# Patient Record
Sex: Female | Born: 1965 | Hispanic: No | Smoking: Never smoker
Health system: Southern US, Community
[De-identification: ages and names within clinical notes are randomized; demographics above are authoritative.]

## PROBLEM LIST (undated history)

## (undated) DIAGNOSIS — J45909 Unspecified asthma, uncomplicated: Secondary | ICD-10-CM

## (undated) DIAGNOSIS — E119 Type 2 diabetes mellitus without complications: Secondary | ICD-10-CM

## (undated) DIAGNOSIS — I10 Essential (primary) hypertension: Secondary | ICD-10-CM

## (undated) DIAGNOSIS — E785 Hyperlipidemia, unspecified: Secondary | ICD-10-CM

## (undated) DIAGNOSIS — E079 Disorder of thyroid, unspecified: Secondary | ICD-10-CM

---

## 2006-01-14 ENCOUNTER — Ambulatory Visit: Payer: Self-pay | Admitting: Family Medicine

## 2006-12-11 ENCOUNTER — Ambulatory Visit: Payer: Self-pay | Admitting: Unknown Physician Specialty

## 2010-11-21 ENCOUNTER — Ambulatory Visit: Payer: Self-pay

## 2013-06-14 ENCOUNTER — Emergency Department: Payer: Self-pay | Admitting: Emergency Medicine

## 2013-06-14 LAB — COMPREHENSIVE METABOLIC PANEL
Bilirubin,Total: 0.4 mg/dL (ref 0.2–1.0)
Co2: 30 mmol/L (ref 21–32)
Creatinine: 1.01 mg/dL (ref 0.60–1.30)
EGFR (African American): >60
EGFR (Non-African Amer.): >60
Glucose: 107 mg/dL — ABNORMAL HIGH (ref 65–99)
SGOT(AST): 31 U/L (ref 15–37)
Sodium: 139 mmol/L (ref 136–145)
Total Protein: 7.8 g/dL (ref 6.4–8.2)

## 2013-06-14 LAB — URINALYSIS, COMPLETE
Bacteria: NONE SEEN
Bilirubin,UR: NEGATIVE
Glucose,UR: NEGATIVE mg/dL (ref 0–75)
Nitrite: NEGATIVE
Ph: 5 (ref 4.5–8.0)
Protein: 100
RBC,UR: NONE SEEN /HPF (ref 0–5)

## 2013-06-14 LAB — CBC: WBC: 5.8 10*3/uL (ref 3.6–11.0)

## 2013-07-11 ENCOUNTER — Other Ambulatory Visit (HOSPITAL_COMMUNITY): Payer: Self-pay

## 2013-07-11 ENCOUNTER — Emergency Department (HOSPITAL_COMMUNITY): Payer: BC Managed Care – PPO

## 2013-07-11 ENCOUNTER — Encounter (HOSPITAL_COMMUNITY): Payer: Self-pay | Admitting: Nurse Practitioner

## 2013-07-11 ENCOUNTER — Emergency Department (HOSPITAL_COMMUNITY)
Admission: EM | Admit: 2013-07-11 | Discharge: 2013-07-11 | Disposition: A | Discharge status: Home / Self Care | Payer: BC Managed Care – PPO | Attending: Emergency Medicine | Admitting: Emergency Medicine

## 2013-07-11 DIAGNOSIS — S6990XA Unspecified injury of unspecified wrist, hand and finger(s), initial encounter: Secondary | ICD-10-CM | POA: Insufficient documentation

## 2013-07-11 DIAGNOSIS — Z79899 Other long term (current) drug therapy: Secondary | ICD-10-CM | POA: Insufficient documentation

## 2013-07-11 DIAGNOSIS — Y939 Activity, unspecified: Secondary | ICD-10-CM | POA: Insufficient documentation

## 2013-07-11 DIAGNOSIS — IMO0002 Reserved for concepts with insufficient information to code with codable children: Secondary | ICD-10-CM | POA: Insufficient documentation

## 2013-07-11 DIAGNOSIS — S46912A Strain of unspecified muscle, fascia and tendon at shoulder and upper arm level, left arm, initial encounter: Secondary | ICD-10-CM

## 2013-07-11 DIAGNOSIS — Z23 Encounter for immunization: Secondary | ICD-10-CM | POA: Insufficient documentation

## 2013-07-11 DIAGNOSIS — S51809A Unspecified open wound of unspecified forearm, initial encounter: Secondary | ICD-10-CM | POA: Insufficient documentation

## 2013-07-11 DIAGNOSIS — S59909A Unspecified injury of unspecified elbow, initial encounter: Secondary | ICD-10-CM | POA: Insufficient documentation

## 2013-07-11 DIAGNOSIS — S61409A Unspecified open wound of unspecified hand, initial encounter: Secondary | ICD-10-CM | POA: Insufficient documentation

## 2013-07-11 DIAGNOSIS — Y92410 Unspecified street and highway as the place of occurrence of the external cause: Secondary | ICD-10-CM | POA: Insufficient documentation

## 2013-07-11 DIAGNOSIS — S59902A Unspecified injury of left elbow, initial encounter: Secondary | ICD-10-CM

## 2013-07-11 MED ORDER — HYDROCODONE-ACETAMINOPHEN 5-325 MG PO TABS: 1.0000 | ORAL_TABLET | Freq: Four times a day (QID) | ORAL | Status: AC | PRN

## 2013-07-11 MED ORDER — NAPROXEN 500 MG PO TABS: 500.0000 mg | ORAL_TABLET | Freq: Two times a day (BID) | ORAL | Status: AC

## 2013-07-11 MED ORDER — TETANUS-DIPHTH-ACELL PERTUSSIS 5-2.5-18.5 LF-MCG/0.5 IM SUSP
0.5000 mL | Freq: Once | INTRAMUSCULAR | Status: AC
  Administered 2013-07-11: 0.5 mL via INTRAMUSCULAR
  Filled 2013-07-11: qty 0.5
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
## 2013-07-11 NOTE — ED Notes (Signed)
Patient transported to CT 

## 2015-07-26 ENCOUNTER — Ambulatory Visit: Payer: 59 | Admitting: Family Medicine

## 2015-08-28 ENCOUNTER — Other Ambulatory Visit: Payer: Self-pay | Admitting: Internal Medicine

## 2015-08-28 DIAGNOSIS — Z1231 Encounter for screening mammogram for malignant neoplasm of breast: Secondary | ICD-10-CM

## 2015-09-06 ENCOUNTER — Ambulatory Visit
Admission: RE | Admit: 2015-09-06 | Discharge: 2015-09-06 | Disposition: A | Discharge status: Home / Self Care | Payer: 59 | Source: Ambulatory Visit | Attending: Internal Medicine | Admitting: Internal Medicine

## 2015-09-06 DIAGNOSIS — Z1231 Encounter for screening mammogram for malignant neoplasm of breast: Secondary | ICD-10-CM | POA: Insufficient documentation

## 2015-09-27 ENCOUNTER — Ambulatory Visit: Payer: 59 | Admitting: Cardiovascular Disease

## 2015-10-06 ENCOUNTER — Ambulatory Visit: Payer: 59 | Admitting: Cardiovascular Disease

## 2015-11-23 ENCOUNTER — Ambulatory Visit: Payer: 59 | Admitting: Cardiovascular Disease

## 2016-10-04 ENCOUNTER — Encounter: Admission: RE | Payer: Self-pay | Source: Ambulatory Visit

## 2016-10-04 ENCOUNTER — Ambulatory Visit: Admission: RE | Admit: 2016-10-04 | Payer: 59 | Source: Ambulatory Visit | Admitting: Unknown Physician Specialty

## 2016-10-04 SURGERY — COLONOSCOPY WITH PROPOFOL
Anesthesia: General

## 2016-12-03 ENCOUNTER — Other Ambulatory Visit: Payer: Self-pay | Admitting: Internal Medicine

## 2016-12-03 DIAGNOSIS — Z1231 Encounter for screening mammogram for malignant neoplasm of breast: Secondary | ICD-10-CM

## 2016-12-04 ENCOUNTER — Ambulatory Visit
Admission: RE | Admit: 2016-12-04 | Discharge: 2016-12-04 | Disposition: A | Discharge status: Home / Self Care | Payer: BLUE CROSS/BLUE SHIELD | Source: Ambulatory Visit | Attending: Internal Medicine | Admitting: Internal Medicine

## 2016-12-04 ENCOUNTER — Encounter: Payer: Self-pay | Admitting: Radiology

## 2016-12-04 DIAGNOSIS — Z1231 Encounter for screening mammogram for malignant neoplasm of breast: Secondary | ICD-10-CM | POA: Insufficient documentation

## 2019-04-21 ENCOUNTER — Other Ambulatory Visit: Payer: Self-pay | Admitting: Internal Medicine

## 2019-04-21 DIAGNOSIS — Z1231 Encounter for screening mammogram for malignant neoplasm of breast: Secondary | ICD-10-CM

## 2019-04-21 DIAGNOSIS — R42 Dizziness and giddiness: Secondary | ICD-10-CM

## 2019-04-21 DIAGNOSIS — H8113 Benign paroxysmal vertigo, bilateral: Secondary | ICD-10-CM

## 2019-05-03 ENCOUNTER — Ambulatory Visit: Payer: PRIVATE HEALTH INSURANCE

## 2020-06-08 ENCOUNTER — Other Ambulatory Visit: Payer: Self-pay | Admitting: Internal Medicine

## 2020-06-08 DIAGNOSIS — Z1231 Encounter for screening mammogram for malignant neoplasm of breast: Secondary | ICD-10-CM

## 2021-09-24 ENCOUNTER — Other Ambulatory Visit: Payer: Self-pay | Admitting: Internal Medicine

## 2021-09-24 DIAGNOSIS — Z1231 Encounter for screening mammogram for malignant neoplasm of breast: Secondary | ICD-10-CM

## 2021-10-11 ENCOUNTER — Ambulatory Visit (INDEPENDENT_AMBULATORY_CARE_PROVIDER_SITE_OTHER): Payer: 59

## 2021-10-11 ENCOUNTER — Encounter: Payer: Self-pay | Admitting: Podiatry

## 2021-10-11 ENCOUNTER — Ambulatory Visit: Payer: 59 | Admitting: Podiatry

## 2021-10-11 ENCOUNTER — Other Ambulatory Visit: Payer: Self-pay

## 2021-10-11 DIAGNOSIS — M7752 Other enthesopathy of left foot: Secondary | ICD-10-CM

## 2021-10-11 DIAGNOSIS — M21861 Other specified acquired deformities of right lower leg: Secondary | ICD-10-CM

## 2021-10-11 DIAGNOSIS — M7750 Other enthesopathy of unspecified foot: Secondary | ICD-10-CM

## 2021-10-11 DIAGNOSIS — M7661 Achilles tendinitis, right leg: Secondary | ICD-10-CM | POA: Diagnosis not present

## 2021-10-11 MED ORDER — MELOXICAM 15 MG PO TABS: 15.0000 mg | ORAL_TABLET | Freq: Every day | ORAL | 3 refills | Status: AC

## 2021-10-11 MED ORDER — METHYLPREDNISOLONE 4 MG PO TBPK: ORAL_TABLET | ORAL | 0 refills | Status: DC

## 2021-10-11 MED ORDER — METHYLPREDNISOLONE 4 MG PO TBPK: ORAL_TABLET | ORAL | 0 refills | Status: AC

## 2021-10-11 MED ORDER — MELOXICAM 15 MG PO TABS: 15.0000 mg | ORAL_TABLET | Freq: Every day | ORAL | 3 refills | Status: DC

## 2021-10-11 NOTE — Progress Notes (Signed)
  Subjective:  Patient ID: Shelia Whitney, female    DOB: 1966/04/29,  MRN: [id number]  Chief Complaint  Patient presents with   Tendonitis      (np) right foot pain near the heel    55 y.o. female presents with the above complaint. History confirmed with patient. Present for quite some time nearly a year but she has become been working through it.  Its mostly in the back of the heel until it is being pulled and stabbing pain.  She 2 weeks ago went to the [employer] store and bought inserts which have helped the pain on the bottom of the foot in the arch but not on the back of the heel this is gotten worse.   Objective:  Physical Exam: warm, good capillary refill, no trophic changes or ulcerative lesions, normal DP and PT pulses, and normal sensory exam. Left Foot: normal exam, no swelling, tenderness, instability; ligaments intact, full range of motion of all ankle/foot joints Right Foot: tenderness at Achilles tendon insertion and gastrocnemius equinus is noted with a positive silverskiold test  No images are attached to the encounter.  Radiographs: Multiple views x-ray of the right foot: no fracture, dislocation, swelling or degenerative changes noted, plantar calcaneal spur, and posterior calcaneal spur Assessment:   1. Achilles tendinitis, right leg   2. Gastrocnemius equinus of right lower extremity      Plan:  Patient was evaluated and treated and all questions answered.  Discussed the etiology and treatment options for Achilles tendinitis including stretching, formal physical therapy with an eccentric exercises therapy plan, supportive shoegears such as a running shoe or sneaker, heel lifts, topical and oral medications.  We also discussed that I do not routinely perform injections in this area because of the risk of an increased damage or rupture of the tendon.  We also discussed the role of surgical treatment of this for patients who do not improve after exhausting non-surgical  treatment options.  -XR reviewed with patient -Educated on stretching and icing of the affected limb. -Rx for Medrol and Meloxicam. Advised to start Meloxicam the day after completion of oral steroid taper. -Recommend WBAT in CAM boot which I dispensed -She has a night splint and she will use this as well -She is traveling to [place] to visit her family soon and I recommended she wear the boot during this time as well  Return in about 4 weeks (around 11/08/2021) for re-check Achilles tendon.

## 2021-10-11 NOTE — Addendum Note (Signed)
Addended byLilian Kapur, Rashied Corallo R on: 10/11/2021 04:57 PM   Modules accepted: Orders

## 2021-10-11 NOTE — Patient Instructions (Signed)
Look for Voltaren gel at the pharmacy over the counter or online (also known as diclofenac 1% gel). Apply to the painful areas 3-4x daily with the supplied dosing card. Allow to dry for 10 minutes before going into socks/shoes   Look for an "EvenUp" shoe attachment on [employer] or at [employer]. This will level out your hips while you are walking in the CAM boot. Wear this on the other foot around a supportive sneaker:       Achilles Tendinitis  with Rehab Achilles tendinitis is a disorder of the Achilles tendon. The Achilles tendon connects the large calf muscles (Gastrocnemius and Soleus) to the heel bone (calcaneus). This tendon is sometimes called the heel cord. It is important for pushing-off and standing on your toes and is important for walking, running, or jumping. Tendinitis is often caused by overuse and repetitive microtrauma. SYMPTOMS Pain, tenderness, swelling, warmth, and redness may occur over the Achilles tendon even at rest. Pain with pushing off, or flexing or extending the ankle. Pain that is worsened after or during activity. CAUSES  Overuse sometimes seen with rapid increase in exercise programs or in sports requiring running and jumping. Poor physical conditioning (strength and flexibility or endurance). Running sports, especially training running down hills. Inadequate warm-up before practice or play or failure to stretch before participation. Injury to the tendon. PREVENTION  Warm up and stretch before practice or competition. Allow time for adequate rest and recovery between practices and competition. Keep up conditioning. Keep up ankle and leg flexibility. Improve or keep muscle strength and endurance. Improve cardiovascular fitness. Use proper technique. Use proper equipment (shoes, skates). To help prevent recurrence, taping, protective strapping, or an adhesive bandage may be recommended for several weeks after healing is complete. PROGNOSIS  Recovery may take  weeks to several months to heal. Longer recovery is expected if symptoms have been prolonged. Recovery is usually quicker if the inflammation is due to a direct blow as compared with overuse or sudden strain. RELATED COMPLICATIONS  Healing time will be prolonged if the condition is not correctly treated. The injury must be given plenty of time to heal. Symptoms can reoccur if activity is resumed too soon. Untreated, tendinitis may increase the risk of tendon rupture requiring additional time for recovery and possibly surgery. TREATMENT  The first treatment consists of rest anti-inflammatory medication, and ice to relieve the pain. Stretching and strengthening exercises after resolution of pain will likely help reduce the risk of recurrence. Referral to a physical therapist or athletic trainer for further evaluation and treatment may be helpful. A walking boot or cast may be recommended to rest the Achilles tendon. This can help break the cycle of inflammation and microtrauma. Arch supports (orthotics) may be prescribed or recommended by your caregiver as an adjunct to therapy and rest. Surgery to remove the inflamed tendon lining or degenerated tendon tissue is rarely necessary and has shown less than predictable results. MEDICATION  Nonsteroidal anti-inflammatory medications, such as aspirin and ibuprofen, may be used for pain and inflammation relief. Do not take within 7 days before surgery. Take these as directed by your caregiver. Contact your caregiver immediately if any bleeding, stomach upset, or signs of allergic reaction occur. Other minor pain relievers, such as acetaminophen, may also be used. Pain relievers may be prescribed as necessary by your caregiver. Do not take prescription pain medication for longer than 4 to 7 days. Use only as directed and only as much as you need. Cortisone injections are rarely indicated.  Cortisone injections may weaken tendons and predispose to rupture. It is  better to give the condition more time to heal than to use them. HEAT AND COLD Cold is used to relieve pain and reduce inflammation for acute and chronic Achilles tendinitis. Cold should be applied for 10 to 15 minutes every 2 to 3 hours for inflammation and pain and immediately after any activity that aggravates your symptoms. Use ice packs or an ice massage. Heat may be used before performing stretching and strengthening activities prescribed by your caregiver. Use a heat pack or a warm soak. SEEK MEDICAL CARE IF: Symptoms get worse or do not improve in 2 weeks despite treatment. New, unexplained symptoms develop. Drugs used in treatment may produce side effects.  EXERCISES:  RANGE OF MOTION (ROM) AND STRETCHING EXERCISES - Achilles Tendinitis  These exercises may help you when beginning to rehabilitate your injury. Your symptoms may resolve with or without further involvement from your physician, physical therapist or athletic trainer. While completing these exercises, remember:  Restoring tissue flexibility helps normal motion to return to the joints. This allows healthier, less painful movement and activity. An effective stretch should be held for at least 30 seconds. A stretch should never be painful. You should only feel a gentle lengthening or release in the stretched tissue.  STRETCH  Gastroc, Standing  Place hands on wall. Extend right / left leg, keeping the front knee somewhat bent. Slightly point your toes inward on your back foot. Keeping your right / left heel on the floor and your knee straight, shift your weight toward the wall, not allowing your back to arch. You should feel a gentle stretch in the right / left calf. Hold this position for 10 seconds. Repeat 3 times. Complete this stretch 2 times per day.  STRETCH  Soleus, Standing  Place hands on wall. Extend right / left leg, keeping the other knee somewhat bent. Slightly point your toes inward on your back foot. Keep  your right / left heel on the floor, bend your back knee, and slightly shift your weight over the back leg so that you feel a gentle stretch deep in your back calf. Hold this position for 10 seconds. Repeat 3 times. Complete this stretch 2 times per day.  STRETCH  Gastrocsoleus, Standing  Note: This exercise can place a lot of stress on your foot and ankle. Please complete this exercise only if specifically instructed by your caregiver.  Place the ball of your right / left foot on a step, keeping your other foot firmly on the same step. Hold on to the wall or a rail for balance. Slowly lift your other foot, allowing your body weight to press your heel down over the edge of the step. You should feel a stretch in your right / left calf. Hold this position for 10 seconds. Repeat this exercise with a slight bend in your knee. Repeat 3 times. Complete this stretch 2 times per day.   STRENGTHENING EXERCISES - Achilles Tendinitis These exercises may help you when beginning to rehabilitate your injury. They may resolve your symptoms with or without further involvement from your physician, physical therapist or athletic trainer. While completing these exercises, remember:  Muscles can gain both the endurance and the strength needed for everyday activities through controlled exercises. Complete these exercises as instructed by your physician, physical therapist or athletic trainer. Progress the resistance and repetitions only as guided. You may experience muscle soreness or fatigue, but the pain or discomfort you  are trying to eliminate should never worsen during these exercises. If this pain does worsen, stop and make certain you are following the directions exactly. If the pain is still present after adjustments, discontinue the exercise until you can discuss the trouble with your clinician.  STRENGTH - Plantar-flexors  Sit with your right / left leg extended. Holding onto both ends of a rubber exercise  band/tubing, loop it around the ball of your foot. Keep a slight tension in the band. Slowly push your toes away from you, pointing them downward. Hold this position for 10 seconds. Return slowly, controlling the tension in the band/tubing. Repeat 3 times. Complete this exercise 2 times per day.   STRENGTH - Plantar-flexors  Stand with your feet shoulder width apart. Steady yourself with a wall or table using as little support as needed. Keeping your weight evenly spread over the width of your feet, rise up on your toes.* Hold this position for 10 seconds. Repeat 3 times. Complete this exercise 2 times per day.  *If this is too easy, shift your weight toward your right / left leg until you feel challenged. Ultimately, you may be asked to do this exercise with your right / left foot only.  STRENGTH  Plantar-flexors, Eccentric  Note: This exercise can place a lot of stress on your foot and ankle. Please complete this exercise only if specifically instructed by your caregiver.  Place the balls of your feet on a step. With your hands, use only enough support from a wall or rail to keep your balance. Keep your knees straight and rise up on your toes. Slowly shift your weight entirely to your right / left toes and pick up your opposite foot. Gently and with controlled movement, lower your weight through your right / left foot so that your heel drops below the level of the step. You will feel a slight stretch in the back of your calf at the end position. Use the healthy leg to help rise up onto the balls of both feet, then lower weight only on the right / left leg again. Build up to 15 repetitions. Then progress to 3 consecutive sets of 15 repetitions.* After completing the above exercise, complete the same exercise with a slight knee bend (about 30 degrees). Again, build up to 15 repetitions. Then progress to 3 consecutive sets of 15 repetitions.* Perform this exercise 2 times per day.  *When you  easily complete 3 sets of 15, your physician, physical therapist or athletic trainer may advise you to add resistance by wearing a backpack filled with additional weight.  STRENGTH - Plantar Flexors, Seated  Sit on a chair that allows your feet to rest flat on the ground. If necessary, sit at the edge of the chair. Keeping your toes firmly on the ground, lift your right / left heel as far as you can without increasing any discomfort in your ankle. Repeat 3 times. Complete this exercise 2 times a day.

## 2021-11-08 ENCOUNTER — Ambulatory Visit: Payer: 59 | Admitting: Podiatry

## 2021-11-20 ENCOUNTER — Ambulatory Visit (INDEPENDENT_AMBULATORY_CARE_PROVIDER_SITE_OTHER): Payer: Self-pay | Admitting: Podiatry

## 2021-11-20 DIAGNOSIS — Z91199 Patient's noncompliance with other medical treatment and regimen due to unspecified reason: Secondary | ICD-10-CM

## 2021-11-20 NOTE — Progress Notes (Signed)
Patient was no-show for appointment today 

## 2021-12-14 ENCOUNTER — Other Ambulatory Visit: Payer: Self-pay

## 2021-12-14 ENCOUNTER — Ambulatory Visit
Admission: RE | Admit: 2021-12-14 | Discharge: 2021-12-14 | Disposition: A | Discharge status: Home / Self Care | Payer: BC Managed Care – PPO | Source: Ambulatory Visit | Attending: Internal Medicine | Admitting: Internal Medicine

## 2021-12-14 DIAGNOSIS — Z1231 Encounter for screening mammogram for malignant neoplasm of breast: Secondary | ICD-10-CM | POA: Diagnosis present

## 2022-01-24 ENCOUNTER — Encounter: Payer: Self-pay | Admitting: Gastroenterology

## 2022-01-25 ENCOUNTER — Ambulatory Visit: Payer: BC Managed Care – PPO | Admitting: Anesthesiology

## 2022-01-25 ENCOUNTER — Encounter
Admission: RE | Disposition: A | Discharge status: Home / Self Care | Payer: Self-pay | Source: Home / Self Care | Attending: Gastroenterology

## 2022-01-25 ENCOUNTER — Encounter: Payer: Self-pay | Admitting: Gastroenterology

## 2022-01-25 ENCOUNTER — Ambulatory Visit
Admission: RE | Admit: 2022-01-25 | Discharge: 2022-01-25 | Disposition: A | Discharge status: Home / Self Care | Payer: BC Managed Care – PPO | Attending: Gastroenterology | Admitting: Gastroenterology

## 2022-01-25 ENCOUNTER — Other Ambulatory Visit: Payer: Self-pay

## 2022-01-25 DIAGNOSIS — E119 Type 2 diabetes mellitus without complications: Secondary | ICD-10-CM | POA: Insufficient documentation

## 2022-01-25 DIAGNOSIS — K529 Noninfective gastroenteritis and colitis, unspecified: Secondary | ICD-10-CM | POA: Insufficient documentation

## 2022-01-25 DIAGNOSIS — K640 First degree hemorrhoids: Secondary | ICD-10-CM | POA: Insufficient documentation

## 2022-01-25 DIAGNOSIS — Z8719 Personal history of other diseases of the digestive system: Secondary | ICD-10-CM | POA: Diagnosis not present

## 2022-01-25 DIAGNOSIS — Z1211 Encounter for screening for malignant neoplasm of colon: Secondary | ICD-10-CM | POA: Diagnosis present

## 2022-01-25 HISTORY — PX: COLONOSCOPY: SHX5424

## 2022-01-25 HISTORY — DX: Disorder of thyroid, unspecified: E07.9

## 2022-01-25 HISTORY — DX: Essential (primary) hypertension: I10

## 2022-01-25 HISTORY — DX: Unspecified asthma, uncomplicated: J45.909

## 2022-01-25 HISTORY — DX: Type 2 diabetes mellitus without complications: E11.9

## 2022-01-25 HISTORY — DX: Hyperlipidemia, unspecified: E78.5

## 2022-01-25 LAB — GLUCOSE, CAPILLARY: Glucose-Capillary: 75 mg/dL (ref 70–99)

## 2022-01-25 SURGERY — COLONOSCOPY
Anesthesia: General

## 2022-01-25 MED ORDER — LIDOCAINE HCL (CARDIAC) PF 100 MG/5ML IV SOSY
PREFILLED_SYRINGE | INTRAVENOUS | Status: DC | PRN
  Administered 2022-01-25: 50 mg via INTRAVENOUS

## 2022-01-25 MED ORDER — PROPOFOL 500 MG/50ML IV EMUL
INTRAVENOUS | Status: DC | PRN
  Administered 2022-01-25: 175 ug/kg/min via INTRAVENOUS

## 2022-01-25 MED ORDER — SODIUM CHLORIDE 0.9 % IV SOLN: INTRAVENOUS | Status: DC

## 2022-01-25 MED ORDER — PROPOFOL 500 MG/50ML IV EMUL
INTRAVENOUS | Status: AC
  Filled 2022-01-25: qty 100

## 2022-01-25 MED ORDER — PROPOFOL 10 MG/ML IV BOLUS
INTRAVENOUS | Status: DC | PRN
Start: 2022-01-25 — End: 2022-01-25
  Administered 2022-01-25: 60 mg via INTRAVENOUS
  Administered 2022-01-25: 20 mg via INTRAVENOUS

## 2022-01-25 NOTE — H&P (Signed)
? ?Pre-Procedure H&P ?  ?Patient ID: Shelia Whitney is a 56 y.o. female. ? ?Gastroenterology Provider: Annamaria Helling, DO ? ?Referring Provider: Dawson Bills, NP ?PCP: Tracie Harrier, MD ? ?Date: 01/25/2022 ? ?HPI ?Ms. Shelia Whitney is a 56 y.o. female who presents today for Colonoscopy for initial screening colonoscopy. ? ?Patient undergoing initial screening colonoscopy today.  Bowel movements at home are normal without melena hematochezia diarrhea or constipation.  She does have a reported history of internal hemorrhoids ?Most recent lab work A1c 6.8 hemoglobin 15.9 MCV 88 platelets 241,000 creatinine 1.0 ? ?No current GI complaints ? ?Past Medical History:  ?Diagnosis Date  ? Asthma   ? Diabetes mellitus without complication ([place])   ? Hyperlipidemia   ? Hypertension   ? Thyroid disease   ? ? ?Past Surgical History:  ?Procedure Laterality Date  ? CESAREAN SECTION    ? ? ?Family History ?No h/o GI disease or malignancy ? ?Review of Systems  ?Constitutional:  Negative for activity change, appetite change, chills, diaphoresis, fatigue, fever and unexpected weight change.  ?HENT:  Negative for trouble swallowing and voice change.   ?Respiratory:  Negative for shortness of breath and wheezing.   ?Cardiovascular:  Negative for chest pain, palpitations and leg swelling.  ?Gastrointestinal:  Negative for abdominal distention, abdominal pain, anal bleeding, blood in stool, constipation, diarrhea, nausea, rectal pain and vomiting.  ?Musculoskeletal:  Negative for arthralgias and myalgias.  ?Skin:  Negative for color change and pallor.  ?Neurological:  Negative for dizziness, syncope and weakness.  ?Psychiatric/Behavioral:  Negative for confusion.   ?All other systems reviewed and are negative.  ? ?Medications ?No current facility-administered medications on file prior to encounter.  ? ?Current Outpatient Medications on File Prior to Encounter  ?Medication Sig Dispense Refill  ? albuterol (PROVENTIL HFA;VENTOLIN HFA)  108 (90 BASE) MCG/ACT inhaler Inhale 2 puffs into the lungs every 6 (six) hours as needed for wheezing.    ? Fluticasone-Salmeterol (ADVAIR) 250-50 MCG/DOSE AEPB Inhale 1 puff into the lungs every 12 (twelve) hours.    ? losartan (COZAAR) 50 MG tablet Take 50 mg by mouth daily.    ? metFORMIN (GLUCOPHAGE) 500 MG tablet Take by mouth 2 (two) times daily with a meal.    ? naproxen (NAPROSYN) 500 MG tablet Take 1 tablet (500 mg total) by mouth 2 (two) times daily. 14 tablet 0  ? Semaglutide,0.25 or 0.5MG /DOS, (OZEMPIC, 0.25 OR 0.5 MG/DOSE,) 2 MG/3ML SOPN Inject into the skin.    ? HYDROcodone-acetaminophen (NORCO/VICODIN) 5-325 MG per tablet Take 1-2 tablets by mouth every 6 (six) hours as needed for pain. (Patient not taking: Reported on 01/25/2022) 10 tablet 0  ? loratadine (CLARITIN) 10 MG tablet Take 10 mg by mouth daily. (Patient not taking: Reported on 01/25/2022)    ? meclizine (ANTIVERT) 25 MG tablet Take 25 mg by mouth 3 (three) times daily as needed for dizziness. (Patient not taking: Reported on 01/25/2022)    ? meloxicam (MOBIC) 15 MG tablet Take 1 tablet (15 mg total) by mouth daily. 30 tablet 3  ? methylPREDNISolone (MEDROL DOSEPAK) 4 MG TBPK tablet 6 day dose pack - take as directed (Patient not taking: Reported on 01/25/2022) 21 tablet 0  ? ? ?Pertinent medications related to GI and procedure were reviewed by me with the patient prior to the procedure ? ? ?Current Facility-Administered Medications:  ?  0.9 %  sodium chloride infusion, , Intravenous, Continuous, Annamaria Helling, DO, Last Rate: 20 mL/hr at 01/25/22 0800, [place]  Bag at 01/25/22 0800 ? sodium chloride 20 mL/hr at 01/25/22 0800  ?  ?  ? ?Allergies  ?Allergen Reactions  ? Bee Pollen   ? ?Allergies were reviewed by me prior to the procedure ? ?Objective  ? ? ?Vitals:  ? 01/25/22 0748  ?BP: (!) 199/79  ?Pulse: 73  ?Resp: 18  ?Temp: (!) 96.6 ?F (35.9 ?C)  ?TempSrc: Temporal  ?SpO2: 100%  ?Weight: 81.2 kg  ?Height: 5' (1.524 m)  ? ? ? ?Physical  Exam ?Vitals and nursing note reviewed.  ?Constitutional:   ?   General: She is not in acute distress. ?   Appearance: Normal appearance. She is not ill-appearing, toxic-appearing or diaphoretic.  ?HENT:  ?   Head: Normocephalic and atraumatic.  ?   Nose: Nose normal.  ?   Mouth/Throat:  ?   Mouth: Mucous membranes are moist.  ?   Pharynx: Oropharynx is clear.  ?Eyes:  ?   General: No scleral icterus. ?   Extraocular Movements: Extraocular movements intact.  ?Cardiovascular:  ?   Rate and Rhythm: Normal rate and regular rhythm.  ?   Heart sounds: Normal heart sounds. No murmur heard. ?  No friction rub. No gallop.  ?Pulmonary:  ?   Effort: Pulmonary effort is normal. No respiratory distress.  ?   Breath sounds: Normal breath sounds. No wheezing, rhonchi or rales.  ?Abdominal:  ?   General: Bowel sounds are normal. There is no distension.  ?   Palpations: Abdomen is soft.  ?   Tenderness: There is no abdominal tenderness. There is no guarding or rebound.  ?Musculoskeletal:  ?   Cervical back: Neck supple.  ?   Right lower leg: No edema.  ?   Left lower leg: No edema.  ?Skin: ?   General: Skin is warm and dry.  ?   Coloration: Skin is not jaundiced or pale.  ?Neurological:  ?   General: No focal deficit present.  ?   Mental Status: She is alert and oriented to person, place, and time. Mental status is at baseline.  ?Psychiatric:     ?   Mood and Affect: Mood normal.     ?   Behavior: Behavior normal.     ?   Thought Content: Thought content normal.     ?   Judgment: Judgment normal.  ? ? ? ?Assessment:  ?Ms. Shelia Whitney is a 56 y.o. female  who presents today for Colonoscopy for initial screening colonoscopy. ? ?Plan:  ?Colonoscopy with possible intervention today ? ?Colonoscopy with possible biopsy, control of bleeding, polypectomy, and interventions as necessary has been discussed with the patient/patient representative. Informed consent was obtained from the patient/patient representative after explaining the  indication, nature, and risks of the procedure including but not limited to death, bleeding, perforation, missed neoplasm/lesions, cardiorespiratory compromise, and reaction to medications. Opportunity for questions was given and appropriate answers were provided. Patient/patient representative has verbalized understanding is amenable to undergoing the procedure. ? ? ?Annamaria Helling, DO  ?[facility] ? ?Portions of the record may have been created with voice recognition software. Occasional wrong-word or 'sound-a-like' substitutions may have occurred due to the inherent limitations of voice recognition software.  Read the chart carefully and recognize, using context, where substitutions may have occurred. ?

## 2022-01-25 NOTE — Op Note (Addendum)
[facility] ?Gastroenterology ?Patient Name: Shelia Whitney ?Procedure Date: 01/25/2022 8:27 AM ?MRN: [id number] ?Account #: [id number] ?Date of Birth: 1966/10/05 ?Admit Type: Outpatient ?Age: 56 ?Room: [facility] ENDO ROOM 3 ?Gender: Female ?Note Status: Finalized ?Instrument Name: Colonoscope [id number] ?Procedure:             Colonoscopy ?Indications:           Screening for colorectal malignant neoplasm ?Providers:             Annamaria Helling DO, DO ?Referring MD:          Tracie Harrier, MD (Referring MD) ?Medicines:             Monitored Anesthesia Care ?Complications:         No immediate complications. Estimated blood loss:  ?                       Minimal. ?Procedure:             Pre-Anesthesia Assessment: ?                       - Prior to the procedure, a History and Physical was  ?                       performed, and patient medications and allergies were  ?                       reviewed. The patient is competent. The risks and  ?                       benefits of the procedure and the sedation options and  ?                       risks were discussed with the patient. All questions  ?                       were answered and informed consent was obtained.  ?                       Patient identification and proposed procedure were  ?                       verified by the physician, the nurse, the anesthetist  ?                       and the technician in the endoscopy suite. Mental  ?                       Status Examination: alert and oriented. Airway  ?                       Examination: normal oropharyngeal airway and neck  ?                       mobility. Respiratory Examination: clear to  ?                       auscultation. CV Examination: RRR, no murmurs, no S3  ?  or S4. Prophylactic Antibiotics: The patient does not  ?                       require prophylactic antibiotics. Prior  ?                       Anticoagulants: The patient has taken no previous  ?                        anticoagulant or antiplatelet agents. ASA Grade  ?                       Assessment: III - A patient with severe systemic  ?                       disease. After reviewing the risks and benefits, the  ?                       patient was deemed in satisfactory condition to  ?                       undergo the procedure. The anesthesia plan was to use  ?                       monitored anesthesia care (MAC). Immediately prior to  ?                       administration of medications, the patient was  ?                       re-assessed for adequacy to receive sedatives. The  ?                       heart rate, respiratory rate, oxygen saturations,  ?                       blood pressure, adequacy of pulmonary ventilation, and  ?                       response to care were monitored throughout the  ?                       procedure. The physical status of the patient was  ?                       re-assessed after the procedure. ?                       After obtaining informed consent, the colonoscope was  ?                       passed under direct vision. Throughout the procedure,  ?                       the patient's blood pressure, pulse, and oxygen  ?                       saturations were monitored continuously. The  ?  Colonoscope was introduced through the anus and  ?                       advanced to the the cecum, identified by appendiceal  ?                       orifice and ileocecal valve. The colonoscopy was  ?                       performed without difficulty. The patient tolerated  ?                       the procedure well. The quality of the bowel  ?                       preparation was evaluated using the BBPS [facility] Bowel  ?                       Preparation Scale) with scores of: Right Colon = 2  ?                       (minor amount of residual staining, small fragments of  ?                       stool and/or opaque liquid, but mucosa seen well),  ?                        Transverse Colon = 3 (entire mucosa seen well with no  ?                       residual staining, small fragments of stool or opaque  ?                       liquid) and Left Colon = 3 (entire mucosa seen well  ?                       with no residual staining, small fragments of stool or  ?                       opaque liquid). The total BBPS score equals 8. The  ?                       quality of the bowel preparation was excellent. The  ?                       terminal ileum, ileocecal valve, appendiceal orifice,  ?                       and rectum were photographed. ?Findings: ?     The perianal and digital rectal examinations were normal. Pertinent  ?     negatives include normal sphincter tone. ?     Localized inflammation, mild in severity and characterized by erosions,  ?     erythema and mucus was found in the terminal ileum and in the ileocecal  ?     valve. Biopsies were taken with a cold forceps for histology. Biopsies  ?  taken at both teh TI and IC valve Estimated blood loss was minimal. ?     Non-bleeding internal hemorrhoids were found during retroflexion. The  ?     hemorrhoids were Grade I (internal hemorrhoids that do not prolapse).  ?     Estimated blood loss: none. ?     The exam was otherwise without abnormality on direct and retroflexion  ?     views. ?Impression:            - Ileitis. Biopsied. ?                       - Non-bleeding internal hemorrhoids. ?                       - The examination was otherwise normal on direct and  ?                       retroflexion views. ?Recommendation:        - Discharge patient to home. ?                       - Resume previous diet. ?                       - Continue present medications. ?                       - Await pathology results. ?                       - Repeat colonoscopy for surveillance based on  ?                       pathology results. ?                       - Return to referring physician as previously  ?                        scheduled. ?                       - The findings and recommendations were discussed with  ?                       the patient. ?Procedure Code(s):     --- Professional --- ?                       [id number], Colonoscopy, flexible; with biopsy, single or  ?                       multiple ?Diagnosis Code(s):     --- Professional --- ?                       Z12.11, Encounter for screening for malignant neoplasm  ?                       of colon ?                       K52.9, Noninfective gastroenteritis and colitis,  ?  unspecified ?                       K64.0, First degree hemorrhoids ?CPT copyright 2019 American Medical Association. All rights reserved. ?The codes documented in this report are preliminary and upon coder review may  ?be revised to meet current compliance requirements. ?Attending Participation: ?     I personally performed the entire procedure. ?Volney American, DO ?Annamaria Helling DO, DO ?01/25/2022 9:13:08 AM ?This report has been signed electronically. ?Number of Addenda: 0 ?Note Initiated On: 01/25/2022 8:27 AM ?Scope Withdrawal Time: 0 hours 25 minutes 52 seconds  ?Total Procedure Duration: 0 hours 35 minutes 15 seconds  ?Estimated Blood Loss:  Estimated blood loss was minimal. ?     [facility] ?

## 2022-01-25 NOTE — Anesthesia Postprocedure Evaluation (Signed)
Anesthesia Post Note ? ?Patient: Shelia Whitney ? ?Procedure(s) Performed: COLONOSCOPY ? ?Patient location during evaluation: PACU ?Anesthesia Type: General ?Level of consciousness: awake and alert, oriented and patient cooperative ?Pain management: pain level controlled ?Vital Signs Assessment: post-procedure vital signs reviewed and stable ?Respiratory status: spontaneous breathing, nonlabored ventilation and respiratory function stable ?Cardiovascular status: blood pressure returned to baseline and stable ?Postop Assessment: adequate PO intake ?Anesthetic complications: no ? ? ?No notable events documented. ? ? ?Last Vitals:  ?Vitals:  ? 01/25/22 0923 01/25/22 0933  ?BP: 125/77 (!) 179/83  ?Pulse: 83 73  ?Resp: 14 14  ?Temp:    ?SpO2: 100% 100%  ?  ?Last Pain:  ?Vitals:  ? 01/25/22 0933  ?TempSrc:   ?PainSc: 0-No pain  ? ? ?  ?  ?  ?  ?  ?  ? ?Reed Breech ? ? ? ? ?

## 2022-01-25 NOTE — Anesthesia Preprocedure Evaluation (Addendum)
Anesthesia Evaluation  ?Patient identified by MRN, date of birth, ID band ?Patient awake ? ? ? ?Reviewed: ?Allergy & Precautions, NPO status , Patient's Chart, lab work & pertinent test results ? ?History of Anesthesia Complications ?Negative for: history of anesthetic complications ? ?Airway ?Mallampati: III ? ? ?Neck ROM: Full ? ? ? Dental ? ?(+)  ?  ?Pulmonary ?asthma ,  ?  ?Pulmonary exam normal ?breath sounds clear to auscultation ? ? ? ? ? ? Cardiovascular ?hypertension, Normal cardiovascular exam ?Rhythm:Regular Rate:Normal ? ? ?  ?Neuro/Psych ?negative neurological ROS ?   ? GI/Hepatic ?negative GI ROS,   ?Endo/Other  ?diabetes, Type 2Class 3 obesity ? Renal/GU ?negative Renal ROS  ? ?  ?Musculoskeletal ? ? Abdominal ?  ?Peds ? Hematology ?negative hematology ROS ?(+)   ?Anesthesia Other Findings ? ? Reproductive/Obstetrics ? ?  ? ? ? ? ? ? ? ? ? ? ? ? ? ?  ?  ? ? ? ? ? ? ? ?Anesthesia Physical ?Anesthesia Plan ? ?ASA: 3 ? ?Anesthesia Plan: General  ? ?Post-op Pain Management:   ? ?Induction: Intravenous ? ?PONV Risk Score and Plan: 3 and Propofol infusion, TIVA and Treatment may vary due to age or medical condition ? ?Airway Management Planned: Natural Airway ? ?Additional Equipment:  ? ?Intra-op Plan:  ? ?Post-operative Plan:  ? ?Informed Consent: I have reviewed the patients History and Physical, chart, labs and discussed the procedure including the risks, benefits and alternatives for the proposed anesthesia with the patient or authorized representative who has indicated his/her understanding and acceptance.  ? ? ? ? ? ?Plan Discussed with: CRNA ? ?Anesthesia Plan Comments: (LMA/GETA backup discussed.  Patient consented for risks of anesthesia including but not limited to:  ?- adverse reactions to medications ?- damage to eyes, teeth, lips or other oral mucosa ?- nerve damage due to positioning  ?- sore throat or hoarseness ?- damage to heart, brain, nerves, lungs, other  parts of body or loss of life ? ?Informed patient about role of CRNA in peri- and intra-operative care.  Patient voiced understanding.)  ? ? ? ? ? ? ?Anesthesia Quick Evaluation ? ?

## 2022-01-25 NOTE — Transfer of Care (Signed)
Immediate Anesthesia Transfer of Care Note ? ?Patient: Shelia Whitney ? ?Procedure(s) Performed: COLONOSCOPY ? ?Patient Location: PACU ? ?Anesthesia Type:General ? ?Level of Consciousness: awake ? ?Airway & Oxygen Therapy: Patient Spontanous Breathing ? ?Post-op Assessment: Report given to RN, Post -op Vital signs reviewed and stable and Patient moving all extremities ? ?Post vital signs: Reviewed and stable ? ?Last Vitals:  ?Vitals Value Taken Time  ?BP    ?Temp    ?Pulse    ?Resp    ?SpO2    ? ? ?Last Pain:  ?Vitals:  ? 01/25/22 0748  ?TempSrc: Temporal  ?PainSc: 0-No pain  ?   ? ?  ? ?Complications: No notable events documented. ?

## 2022-01-25 NOTE — Interval H&P Note (Signed)
History and Physical Interval Note: Preprocedure H&P from 01/25/22 ? was reviewed and there was no interval change after seeing and examining the patient.  Written consent was obtained from the patient after discussion of risks, benefits, and alternatives. Patient has consented to proceed with Colonoscopy with possible intervention ? ? ?01/25/2022 ?8:25 AM ? ?Shelia Whitney  has presented today for surgery, with the diagnosis of Screening for colon cancer (Z12.11).  The various methods of treatment have been discussed with the patient and family. After consideration of risks, benefits and other options for treatment, the patient has consented to  Procedure(s) with comments: ?COLONOSCOPY (N/A) - DM as a surgical intervention.  The patient's history has been reviewed, patient examined, no change in status, stable for surgery.  I have reviewed the patient's chart and labs.  Questions were answered to the patient's satisfaction.   ? ? ?[name] ? ? ?

## 2022-01-25 NOTE — Anesthesia Procedure Notes (Signed)
Procedure Name: [place] ?Date/Time: 01/25/2022 8:30 AM ?Performed by: Jerrye Noble, CRNA ?Pre-anesthesia Checklist: Patient identified, Emergency Drugs available, Suction available and Patient being monitored ?Patient Re-evaluated:Patient Re-evaluated prior to induction ?Oxygen Delivery Method: Nasal cannula ? ? ? ? ?

## 2022-01-28 ENCOUNTER — Encounter: Payer: Self-pay | Admitting: Gastroenterology

## 2022-01-28 LAB — SURGICAL PATHOLOGY

## 2022-03-13 ENCOUNTER — Other Ambulatory Visit: Payer: Self-pay | Admitting: Podiatry

## 2022-04-08 ENCOUNTER — Ambulatory Visit: Payer: PRIVATE HEALTH INSURANCE | Admitting: Dermatology

## 2022-04-22 ENCOUNTER — Ambulatory Visit: Payer: Self-pay | Admitting: Dermatology

## 2022-10-17 ENCOUNTER — Other Ambulatory Visit: Payer: Self-pay | Admitting: Internal Medicine

## 2022-10-17 DIAGNOSIS — Z1231 Encounter for screening mammogram for malignant neoplasm of breast: Secondary | ICD-10-CM

## 2022-11-11 ENCOUNTER — Encounter: Admission: RE | Payer: Self-pay | Source: Home / Self Care

## 2022-11-11 ENCOUNTER — Ambulatory Visit: Admission: RE | Admit: 2022-11-11 | Payer: Medicaid Other | Source: Home / Self Care | Admitting: Gastroenterology

## 2022-11-11 SURGERY — COLONOSCOPY WITH PROPOFOL
Anesthesia: General

## 2022-12-16 ENCOUNTER — Ambulatory Visit
Admission: RE | Admit: 2022-12-16 | Discharge: 2022-12-16 | Disposition: A | Discharge status: Home / Self Care | Payer: BC Managed Care – PPO | Source: Ambulatory Visit | Attending: Internal Medicine | Admitting: Internal Medicine

## 2022-12-16 DIAGNOSIS — Z1231 Encounter for screening mammogram for malignant neoplasm of breast: Secondary | ICD-10-CM | POA: Diagnosis present

## 2022-12-18 ENCOUNTER — Other Ambulatory Visit: Payer: Self-pay | Admitting: Internal Medicine

## 2022-12-18 DIAGNOSIS — R928 Other abnormal and inconclusive findings on diagnostic imaging of breast: Secondary | ICD-10-CM

## 2022-12-18 DIAGNOSIS — N6489 Other specified disorders of breast: Secondary | ICD-10-CM

## 2022-12-20 ENCOUNTER — Ambulatory Visit
Admission: RE | Admit: 2022-12-20 | Discharge: 2022-12-20 | Disposition: A | Discharge status: Home / Self Care | Payer: BC Managed Care – PPO | Source: Ambulatory Visit | Attending: Internal Medicine | Admitting: Internal Medicine

## 2022-12-20 DIAGNOSIS — N6489 Other specified disorders of breast: Secondary | ICD-10-CM | POA: Diagnosis present

## 2022-12-20 DIAGNOSIS — R928 Other abnormal and inconclusive findings on diagnostic imaging of breast: Secondary | ICD-10-CM | POA: Diagnosis present

## 2023-02-03 ENCOUNTER — Ambulatory Visit
Admission: RE | Admit: 2023-02-03 | Discharge: 2023-02-03 | Disposition: A | Discharge status: Home / Self Care | Payer: BC Managed Care – PPO | Attending: Gastroenterology | Admitting: Gastroenterology

## 2023-02-03 ENCOUNTER — Encounter
Admission: RE | Disposition: A | Discharge status: Home / Self Care | Payer: Self-pay | Source: Home / Self Care | Attending: Gastroenterology

## 2023-02-03 ENCOUNTER — Other Ambulatory Visit: Payer: Self-pay

## 2023-02-03 ENCOUNTER — Ambulatory Visit: Payer: BC Managed Care – PPO | Admitting: Certified Registered"

## 2023-02-03 ENCOUNTER — Encounter: Payer: Self-pay | Admitting: Gastroenterology

## 2023-02-03 DIAGNOSIS — E079 Disorder of thyroid, unspecified: Secondary | ICD-10-CM | POA: Insufficient documentation

## 2023-02-03 DIAGNOSIS — K640 First degree hemorrhoids: Secondary | ICD-10-CM | POA: Insufficient documentation

## 2023-02-03 DIAGNOSIS — E119 Type 2 diabetes mellitus without complications: Secondary | ICD-10-CM | POA: Diagnosis not present

## 2023-02-03 DIAGNOSIS — E785 Hyperlipidemia, unspecified: Secondary | ICD-10-CM | POA: Insufficient documentation

## 2023-02-03 DIAGNOSIS — Z6839 Body mass index (BMI) 39.0-39.9, adult: Secondary | ICD-10-CM | POA: Diagnosis not present

## 2023-02-03 DIAGNOSIS — D127 Benign neoplasm of rectosigmoid junction: Secondary | ICD-10-CM | POA: Diagnosis not present

## 2023-02-03 DIAGNOSIS — I10 Essential (primary) hypertension: Secondary | ICD-10-CM | POA: Insufficient documentation

## 2023-02-03 DIAGNOSIS — Z7984 Long term (current) use of oral hypoglycemic drugs: Secondary | ICD-10-CM | POA: Diagnosis not present

## 2023-02-03 DIAGNOSIS — D123 Benign neoplasm of transverse colon: Secondary | ICD-10-CM | POA: Diagnosis not present

## 2023-02-03 DIAGNOSIS — J45909 Unspecified asthma, uncomplicated: Secondary | ICD-10-CM | POA: Insufficient documentation

## 2023-02-03 DIAGNOSIS — K5289 Other specified noninfective gastroenteritis and colitis: Secondary | ICD-10-CM | POA: Insufficient documentation

## 2023-02-03 DIAGNOSIS — K529 Noninfective gastroenteritis and colitis, unspecified: Secondary | ICD-10-CM | POA: Diagnosis present

## 2023-02-03 DIAGNOSIS — K644 Residual hemorrhoidal skin tags: Secondary | ICD-10-CM | POA: Diagnosis not present

## 2023-02-03 HISTORY — PX: COLONOSCOPY: SHX5424

## 2023-02-03 LAB — GLUCOSE, CAPILLARY: Glucose-Capillary: 81 mg/dL (ref 70–99)

## 2023-02-03 SURGERY — COLONOSCOPY
Anesthesia: General

## 2023-02-03 MED ORDER — LIDOCAINE HCL (CARDIAC) PF 100 MG/5ML IV SOSY
PREFILLED_SYRINGE | INTRAVENOUS | Status: DC | PRN
  Administered 2023-02-03: 100 mg via INTRAVENOUS

## 2023-02-03 MED ORDER — LIDOCAINE HCL (PF) 2 % IJ SOLN
INTRAMUSCULAR | Status: AC
  Filled 2023-02-03: qty 5

## 2023-02-03 MED ORDER — PROPOFOL 10 MG/ML IV BOLUS
INTRAVENOUS | Status: DC | PRN
  Administered 2023-02-03: 150 ug/kg/min via INTRAVENOUS
  Administered 2023-02-03: 100 mg via INTRAVENOUS
  Administered 2023-02-03: 140 mg via INTRAVENOUS

## 2023-02-03 MED ORDER — SODIUM CHLORIDE 0.9 % IV SOLN: INTRAVENOUS | Status: DC

## 2023-02-03 MED ORDER — PROPOFOL 10 MG/ML IV BOLUS
INTRAVENOUS | Status: AC
  Filled 2023-02-03: qty 40

## 2023-02-03 NOTE — Transfer of Care (Signed)
Immediate Anesthesia Transfer of Care Note  Patient: Shelia Whitney  Procedure(s) Performed: COLONOSCOPY  Patient Location: PACU  Anesthesia Type:General  Level of Consciousness: awake, alert , and oriented  Airway & Oxygen Therapy: Patient Spontanous Breathing  Post-op Assessment: Report given to RN and Post -op Vital signs reviewed and stable  Post vital signs: Reviewed and stable  Last Vitals:  Vitals Value Taken Time  BP 123/69 1111  Temp 35.9 1111  Pulse 68 1111  Resp 16 1111  SpO2 97 1111    Last Pain:  Vitals:   02/03/23 1008  TempSrc: Temporal  PainSc: 0-No pain         Complications: No notable events documented.

## 2023-02-03 NOTE — Interval H&P Note (Signed)
History and Physical Interval Note: Preprocedure H&P from 02/03/23  was reviewed and there was no interval change after seeing and examining the patient.  Written consent was obtained from the patient after discussion of risks, benefits, and alternatives. Patient has consented to proceed with Colonoscopy with possible intervention   02/03/2023 10:35 AM  Shelia Whitney  has presented today for surgery, with the diagnosis of Ileitis, unspecified (K52.9).  The various methods of treatment have been discussed with the patient and family. After consideration of risks, benefits and other options for treatment, the patient has consented to  Procedure(s): COLONOSCOPY (N/A) as a surgical intervention.  The patient's history has been reviewed, patient examined, no change in status, stable for surgery.  I have reviewed the patient's chart and labs.  Questions were answered to the patient's satisfaction.     Annamaria Helling

## 2023-02-03 NOTE — H&P (Signed)
Pre-Procedure H&P   Patient ID: Shelia Whitney is a 57 y.o. female.  Gastroenterology Provider: Annamaria Helling, DO  Referring Provider: Dawson Bills, NP PCP: Tracie Harrier, MD  Date: 02/03/2023  HPI Ms. Shelia Whitney is a 57 y.o. female who presents today for Colonoscopy for ileitis, colitis .  Patient underwent colonoscopy in March 2023 demonstrating ileitis and colitis.  She is overall asymptomatic with regular bowel movements and no melena or hematochezia.  No family history of colon cancer or IBD.  She has not taken ozempic in 5 months.   Past Medical History:  Diagnosis Date   Asthma    Diabetes mellitus without complication    Hyperlipidemia    Hypertension    Thyroid disease     Past Surgical History:  Procedure Laterality Date   CESAREAN SECTION     COLONOSCOPY N/A 01/25/2022   Procedure: COLONOSCOPY;  Surgeon: Annamaria Helling, DO;  Location: [facility] ENDOSCOPY;  Service: Gastroenterology;  Laterality: N/A;  DM    Family History No h/o GI disease or malignancy  Review of Systems  Constitutional:  Negative for activity change, appetite change, chills, diaphoresis, fatigue, fever and unexpected weight change.  HENT:  Negative for trouble swallowing and voice change.   Respiratory:  Negative for shortness of breath and wheezing.   Cardiovascular:  Negative for chest pain, palpitations and leg swelling.  Gastrointestinal:  Negative for abdominal distention, abdominal pain, anal bleeding, blood in stool, constipation, diarrhea, nausea, rectal pain and vomiting.  Musculoskeletal:  Negative for arthralgias and myalgias.  Skin:  Negative for color change and pallor.  Neurological:  Negative for dizziness, syncope and weakness.  Psychiatric/Behavioral:  Negative for confusion.   All other systems reviewed and are negative.    Medications No current facility-administered medications on file prior to encounter.   Current Outpatient Medications on File  Prior to Encounter  Medication Sig Dispense Refill   losartan (COZAAR) 50 MG tablet Take 50 mg by mouth daily.     metFORMIN (GLUCOPHAGE) 500 MG tablet Take by mouth 2 (two) times daily with a meal.     albuterol (PROVENTIL HFA;VENTOLIN HFA) 108 (90 BASE) MCG/ACT inhaler Inhale 2 puffs into the lungs every 6 (six) hours as needed for wheezing.     Fluticasone-Salmeterol (ADVAIR) 250-50 MCG/DOSE AEPB Inhale 1 puff into the lungs every 12 (twelve) hours.     HYDROcodone-acetaminophen (NORCO/VICODIN) 5-325 MG per tablet Take 1-2 tablets by mouth every 6 (six) hours as needed for pain. (Patient not taking: Reported on 01/25/2022) 10 tablet 0   loratadine (CLARITIN) 10 MG tablet Take 10 mg by mouth daily. (Patient not taking: Reported on 01/25/2022)     meclizine (ANTIVERT) 25 MG tablet Take 25 mg by mouth 3 (three) times daily as needed for dizziness. (Patient not taking: Reported on 01/25/2022)     meloxicam (MOBIC) 15 MG tablet Take 1 tablet (15 mg total) by mouth daily. 30 tablet 3   methylPREDNISolone (MEDROL DOSEPAK) 4 MG TBPK tablet 6 day dose pack - take as directed (Patient not taking: Reported on 01/25/2022) 21 tablet 0   naproxen (NAPROSYN) 500 MG tablet Take 1 tablet (500 mg total) by mouth 2 (two) times daily. 14 tablet 0   Semaglutide,0.25 or 0.5MG /DOS, (OZEMPIC, 0.25 OR 0.5 MG/DOSE,) 2 MG/3ML SOPN Inject into the skin. (Patient not taking: Reported on 02/03/2023)      Pertinent medications related to GI and procedure were reviewed by me with the patient prior to the procedure  Current Facility-Administered Medications:    0.9 %  sodium chloride infusion, , Intravenous, Continuous, Annamaria Helling, DO, Last Rate: 20 mL/hr at 02/03/23 1019, New Bag at 02/03/23 1019      Allergies  Allergen Reactions   Bee Pollen    Allergies were reviewed by me prior to the procedure  Objective   Body mass index is 39.6 kg/m. Vitals:   02/03/23 1008  BP: (!) 159/83  Pulse: 65  Resp: 16   Temp: (!) 96.4 F (35.8 C)  TempSrc: Temporal  SpO2: 100%  Weight: 83 kg  Height: 4\' 9"  (1.448 m)     Physical Exam Vitals and nursing note reviewed.  Constitutional:      General: She is not in acute distress.    Appearance: Normal appearance. She is obese. She is not ill-appearing, toxic-appearing or diaphoretic.  HENT:     Head: Normocephalic and atraumatic.     Nose: Nose normal.     Mouth/Throat:     Mouth: Mucous membranes are moist.     Pharynx: Oropharynx is clear.  Eyes:     General: No scleral icterus.    Extraocular Movements: Extraocular movements intact.  Cardiovascular:     Rate and Rhythm: Normal rate and regular rhythm.     Heart sounds: Normal heart sounds. No murmur heard.    No friction rub. No gallop.  Pulmonary:     Effort: Pulmonary effort is normal. No respiratory distress.     Breath sounds: Normal breath sounds. No wheezing, rhonchi or rales.  Abdominal:     General: Bowel sounds are normal. There is no distension.     Palpations: Abdomen is soft.     Tenderness: There is no abdominal tenderness. There is no guarding or rebound.  Musculoskeletal:     Cervical back: Neck supple.     Right lower leg: No edema.     Left lower leg: No edema.  Skin:    General: Skin is warm and dry.     Coloration: Skin is not jaundiced or pale.  Neurological:     General: No focal deficit present.     Mental Status: She is alert and oriented to person, place, and time. Mental status is at baseline.  Psychiatric:        Mood and Affect: Mood normal.        Behavior: Behavior normal.        Thought Content: Thought content normal.        Judgment: Judgment normal.      Assessment:  Ms. Shelia Whitney is a 57 y.o. female  who presents today for Colonoscopy for ileitis, colitis .  Plan:  Colonoscopy with possible intervention today  Colonoscopy with possible biopsy, control of bleeding, polypectomy, and interventions as necessary has been discussed with the  patient/patient representative. Informed consent was obtained from the patient/patient representative after explaining the indication, nature, and risks of the procedure including but not limited to death, bleeding, perforation, missed neoplasm/lesions, cardiorespiratory compromise, and reaction to medications. Opportunity for questions was given and appropriate answers were provided. Patient/patient representative has verbalized understanding is amenable to undergoing the procedure.   Annamaria Helling, DO  [facility] Gastroenterology  Portions of the record may have been created with voice recognition software. Occasional wrong-word or 'sound-a-like' substitutions may have occurred due to the inherent limitations of voice recognition software.  Read the chart carefully and recognize, using context, where substitutions may have occurred.

## 2023-02-03 NOTE — Op Note (Signed)
[facility] Gastroenterology Patient Name: Shelia Whitney Procedure Date: 02/03/2023 10:29 AM MRN: [id number] Account #: [id number] Date of Birth: 1966/01/15 Admit Type: Outpatient Age: 57 Room: [facility] ENDO ROOM 2 Gender: Female Note Status: Finalized Instrument Name: Colonscope [id number] Procedure:             Colonoscopy Indications:           Follow-up of colitis Providers:             Annamaria Helling DO, DO Referring MD:          Tracie Harrier, MD (Referring MD) Medicines:             Monitored Anesthesia Care Complications:         No immediate complications. Estimated blood loss:                         Minimal. Procedure:             Pre-Anesthesia Assessment:                        - Prior to the procedure, a History and Physical was                         performed, and patient medications and allergies were                         reviewed. The patient is competent. The risks and                         benefits of the procedure and the sedation options and                         risks were discussed with the patient. All questions                         were answered and informed consent was obtained.                         Patient identification and proposed procedure were                         verified by the physician, the nurse, the anesthetist                         and the technician in the endoscopy suite. Mental                         Status Examination: alert and oriented. Airway                         Examination: normal oropharyngeal airway and neck                         mobility. Respiratory Examination: clear to                         auscultation. CV Examination: RRR, no murmurs, no S3  or S4. Prophylactic Antibiotics: The patient does not                         require prophylactic antibiotics. Prior                         Anticoagulants: The patient has taken no anticoagulant                          or antiplatelet agents. ASA Grade Assessment: III - A                         patient with severe systemic disease. After reviewing                         the risks and benefits, the patient was deemed in                         satisfactory condition to undergo the procedure. The                         anesthesia plan was to use monitored anesthesia care                         (MAC). Immediately prior to administration of                         medications, the patient was re-assessed for adequacy                         to receive sedatives. The heart rate, respiratory                         rate, oxygen saturations, blood pressure, adequacy of                         pulmonary ventilation, and response to care were                         monitored throughout the procedure. The physical                         status of the patient was re-assessed after the                         procedure.                        After obtaining informed consent, the colonoscope was                         passed under direct vision. Throughout the procedure,                         the patient's blood pressure, pulse, and oxygen                         saturations were monitored continuously. The  Colonoscope was introduced through the anus and                         advanced to the the cecum, identified by appendiceal                         orifice and ileocecal valve. The colonoscopy was                         performed without difficulty. The patient tolerated                         the procedure well. The quality of the bowel                         preparation was evaluated using the BBPS [facility] Bowel                         Preparation Scale) with scores of: Right Colon = 2                         (minor amount of residual staining, small fragments of                         stool and/or opaque liquid, but mucosa seen well),                         Transverse  Colon = 3 (entire mucosa seen well with no                         residual staining, small fragments of stool or opaque                         liquid) and Left Colon = 3 (entire mucosa seen well                         with no residual staining, small fragments of stool or                         opaque liquid). The total BBPS score equals 8. The                         quality of the bowel preparation was excellent. The                         ileocecal valve, appendiceal orifice, and rectum were                         photographed. Findings:      Skin tags were found on perianal exam.      The digital rectal exam was normal. Pertinent negatives include normal       sphincter tone.      Localized mild inflammation characterized by erosions was found in the       ileocecal valve. Biopsies were taken with a cold forceps for histology.       Estimated blood loss was minimal.  A 2 to 3 mm polyp was found in the recto-sigmoid colon. The polyp was       sessile. The polyp was removed with a cold snare. Resection and       retrieval were complete. Estimated blood loss was minimal.      Two sessile polyps were found in the recto-sigmoid colon and transverse       colon. The polyps were 1 to 2 mm in size. These polyps were removed with       a jumbo cold forceps. Resection and retrieval were complete. Estimated       blood loss was minimal.      Non-bleeding internal hemorrhoids were found during retroflexion. The       hemorrhoids were Grade I (internal hemorrhoids that do not prolapse).       Estimated blood loss: none.      The exam was otherwise without abnormality on direct and retroflexion       views. Impression:            - Perianal skin tags found on perianal exam.                        - Mild inflammation was found in the ileum secondary                         to ileitis. Biopsied.                        - One 2 to 3 mm polyp at the recto-sigmoid colon,                          removed with a cold snare. Resected and retrieved.                        - Two 1 to 2 mm polyps at the recto-sigmoid colon and                         in the transverse colon, removed with a jumbo cold                         forceps. Resected and retrieved.                        - Non-bleeding internal hemorrhoids.                        - The examination was otherwise normal on direct and                         retroflexion views. Recommendation:        - Patient has a contact number available for                         emergencies. The signs and symptoms of potential                         delayed complications were discussed with the patient.                         Return  to normal activities tomorrow. Written                         discharge instructions were provided to the patient.                        - Discharge patient to home.                        - Resume previous diet.                        - Continue present medications.                        - Await pathology results.                        - Repeat colonoscopy for surveillance based on                         pathology results.                        - Return to GI office as previously scheduled.                        - The findings and recommendations were discussed with                         the patient. Procedure Code(s):     --- Professional ---                        [phone], Colonoscopy, flexible; with removal of                         tumor(s), polyp(s), or other lesion(s) by snare                         technique                        45380, 29, Colonoscopy, flexible; with biopsy, single                         or multiple Diagnosis Code(s):     --- Professional ---                        K52.9, Noninfective gastroenteritis and colitis,                         unspecified                        D12.7, Benign neoplasm of rectosigmoid junction                        D12.3, Benign neoplasm of  transverse colon (hepatic                         flexure or splenic flexure)  K64.0, First degree hemorrhoids                        K64.4, Residual hemorrhoidal skin tags CPT copyright 2022 American Medical Association. All rights reserved. The codes documented in this report are preliminary and upon coder review may  be revised to meet current compliance requirements. Attending Participation:      I personally performed the entire procedure. Volney American, DO Annamaria Helling DO, DO 02/03/2023 11:11:50 AM This report has been signed electronically. Number of Addenda: 0 Note Initiated On: 02/03/2023 10:29 AM Scope Withdrawal Time: 0 hours 16 minutes 17 seconds  Total Procedure Duration: 0 hours 21 minutes 4 seconds  Estimated Blood Loss:  Estimated blood loss was minimal.      [facility]

## 2023-02-03 NOTE — Anesthesia Preprocedure Evaluation (Signed)
Anesthesia Evaluation  Patient identified by MRN, date of birth, ID band Patient awake    Reviewed: Allergy & Precautions, H&P , NPO status , Patient's Chart, lab work & pertinent test results, reviewed documented beta blocker date and time   Airway Mallampati: II   Neck ROM: full    Dental  (+) Poor Dentition   Pulmonary neg shortness of breath, asthma    Pulmonary exam normal        Cardiovascular Exercise Tolerance: Good hypertension, On Medications negative cardio ROS Normal cardiovascular exam Rhythm:regular Rate:Normal     Neuro/Psych negative neurological ROS  negative psych ROS   GI/Hepatic negative GI ROS, Neg liver ROS,,,  Endo/Other  diabetes, Well Controlled  Morbid obesity  Renal/GU negative Renal ROS  negative genitourinary   Musculoskeletal   Abdominal   Peds  Hematology negative hematology ROS (+)   Anesthesia Other Findings Past Medical History: No date: Asthma No date: Diabetes mellitus without complication No date: Hyperlipidemia No date: Hypertension No date: Thyroid disease Past Surgical History: No date: CESAREAN SECTION 01/25/2022: COLONOSCOPY; N/A     Comment:  Procedure: COLONOSCOPY;  Surgeon: Annamaria Helling,              DO;  Location: [facility] ENDOSCOPY;  Service:               Gastroenterology;  Laterality: N/A;  DM BMI    Body Mass Index: 39.60 kg/m     Reproductive/Obstetrics negative OB ROS                             Anesthesia Physical Anesthesia Plan  ASA: 3  Anesthesia Plan: General   Post-op Pain Management:    Induction:   PONV Risk Score and Plan:   Airway Management Planned:   Additional Equipment:   Intra-op Plan:   Post-operative Plan:   Informed Consent: I have reviewed the patients History and Physical, chart, labs and discussed the procedure including the risks, benefits and alternatives for the proposed anesthesia  with the patient or authorized representative who has indicated his/her understanding and acceptance.     Dental Advisory Given  Plan Discussed with: CRNA  Anesthesia Plan Comments:        Anesthesia Quick Evaluation

## 2023-02-04 ENCOUNTER — Encounter: Payer: Self-pay | Admitting: Gastroenterology

## 2023-02-04 LAB — SURGICAL PATHOLOGY

## 2023-02-05 NOTE — Anesthesia Postprocedure Evaluation (Signed)
Anesthesia Post Note  Patient: Shelia Whitney  Procedure(s) Performed: COLONOSCOPY  Patient location during evaluation: PACU Anesthesia Type: General Level of consciousness: awake and alert Pain management: pain level controlled Vital Signs Assessment: post-procedure vital signs reviewed and stable Respiratory status: spontaneous breathing, nonlabored ventilation, respiratory function stable and patient connected to nasal cannula oxygen Cardiovascular status: blood pressure returned to baseline and stable Postop Assessment: no apparent nausea or vomiting Anesthetic complications: no   No notable events documented.   Last Vitals:  Vitals:   02/03/23 1127 02/03/23 1128  BP: (!) 162/83 (!) 171/72  Pulse: 73 68  Resp: 15 12  Temp:    SpO2: 100% 100%    Last Pain:  Vitals:   02/04/23 0734  TempSrc:   PainSc: [place]

## 2023-07-07 IMAGING — MG MM DIGITAL SCREENING BILAT W/ TOMO AND CAD
8 series · 8 of 24 positions shown · non-contrast
Comparison: Previous exam(s).

CLINICAL DATA: Screening.

EXAM:
DIGITAL SCREENING BILATERAL MAMMOGRAM WITH TOMOSYNTHESIS AND CAD
TECHNIQUE: Bilateral screening digital craniocaudal and mediolateral oblique
mammograms were obtained. Bilateral screening digital breast
tomosynthesis was performed. The images were evaluated with
computer-aided detection.

[L MLO synth-2D]
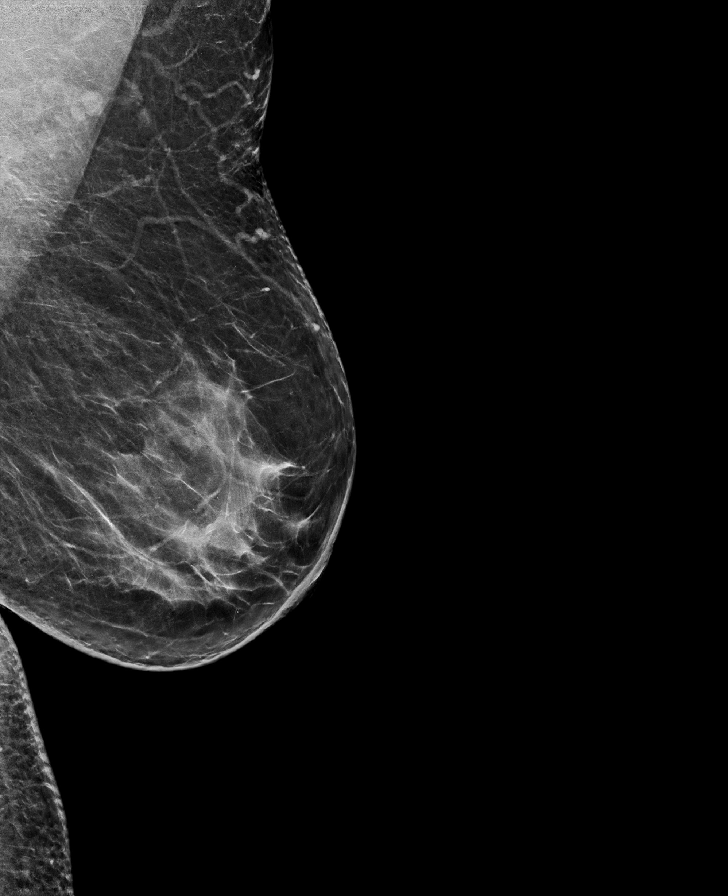

[L CC synth-2D]
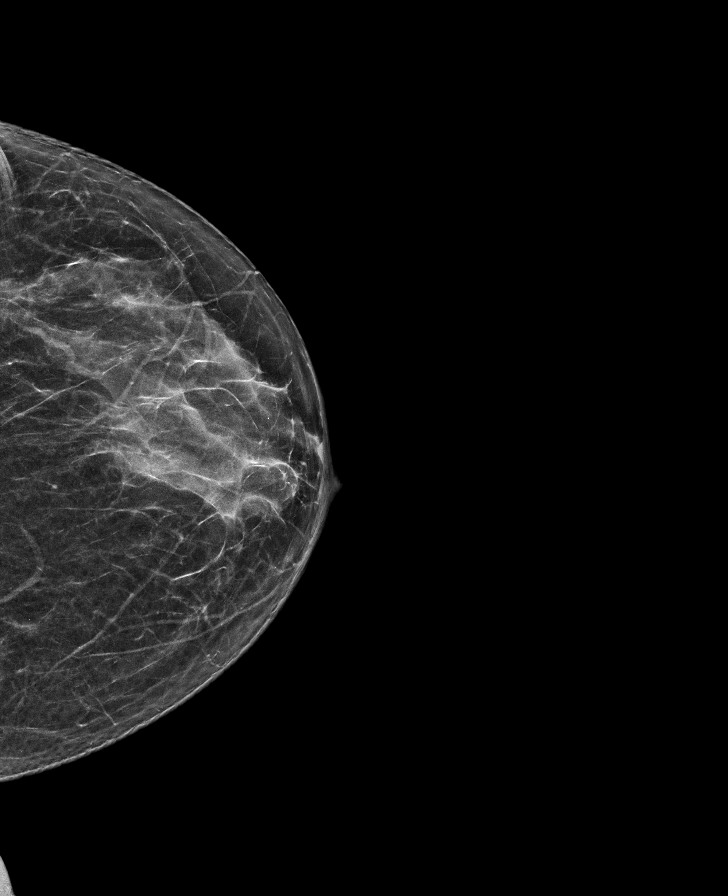

[R CC synth-2D]
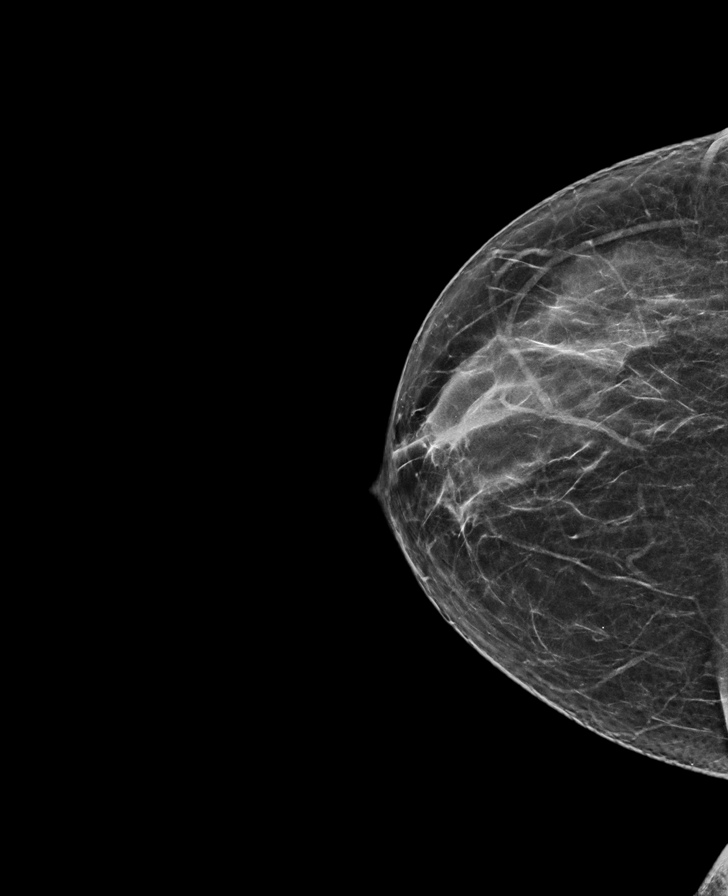

[R MLO synth-2D]
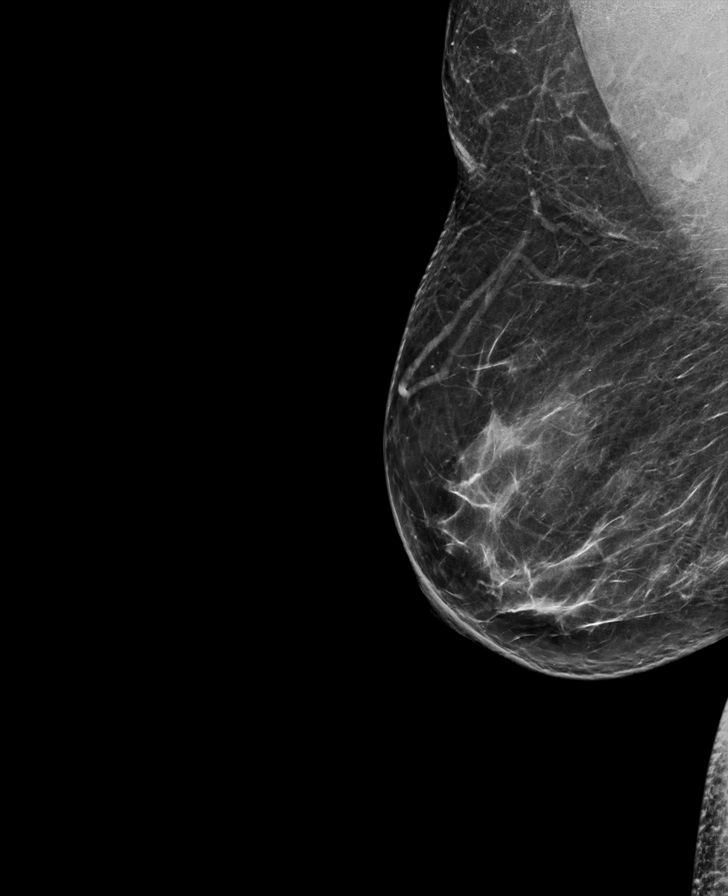

[L MLO tomo · tomo slice 39/76.0]
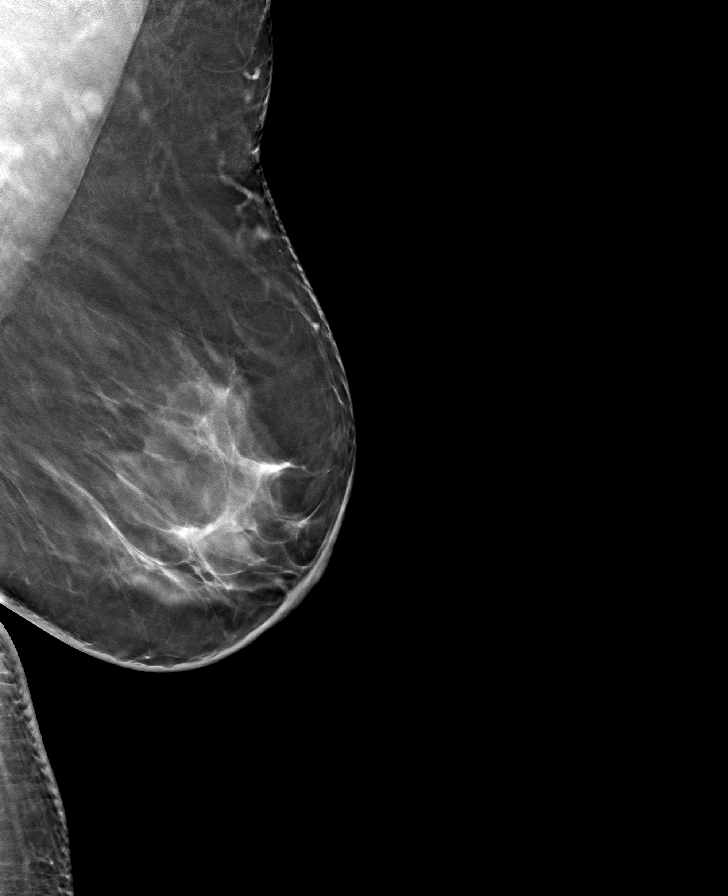

[R MLO tomo · tomo slice 39/77.0]
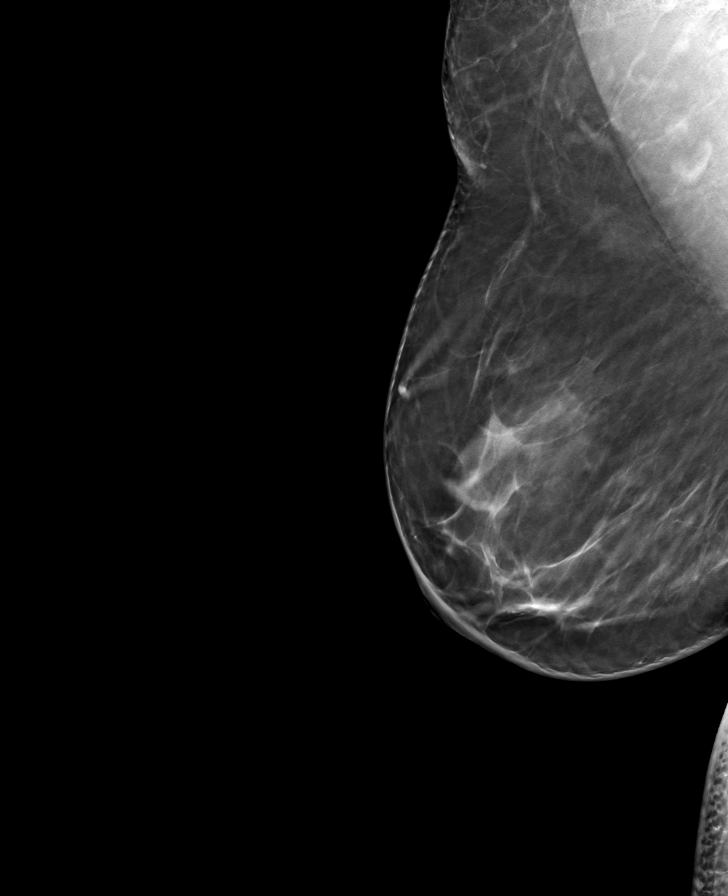

[L CC tomo · tomo slice 30/59.0]
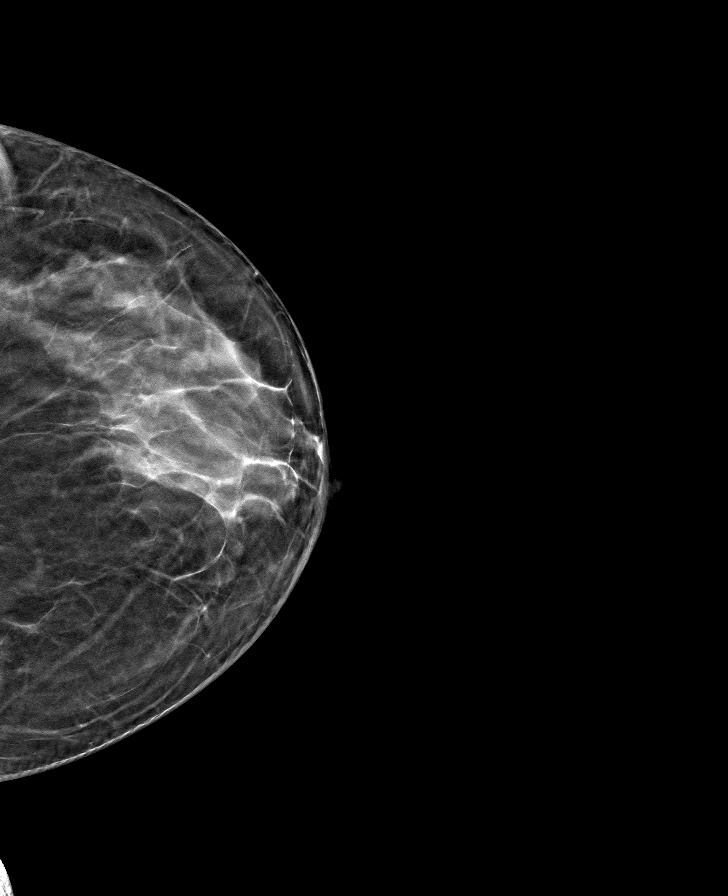

[R CC tomo · tomo slice 33/64.0]
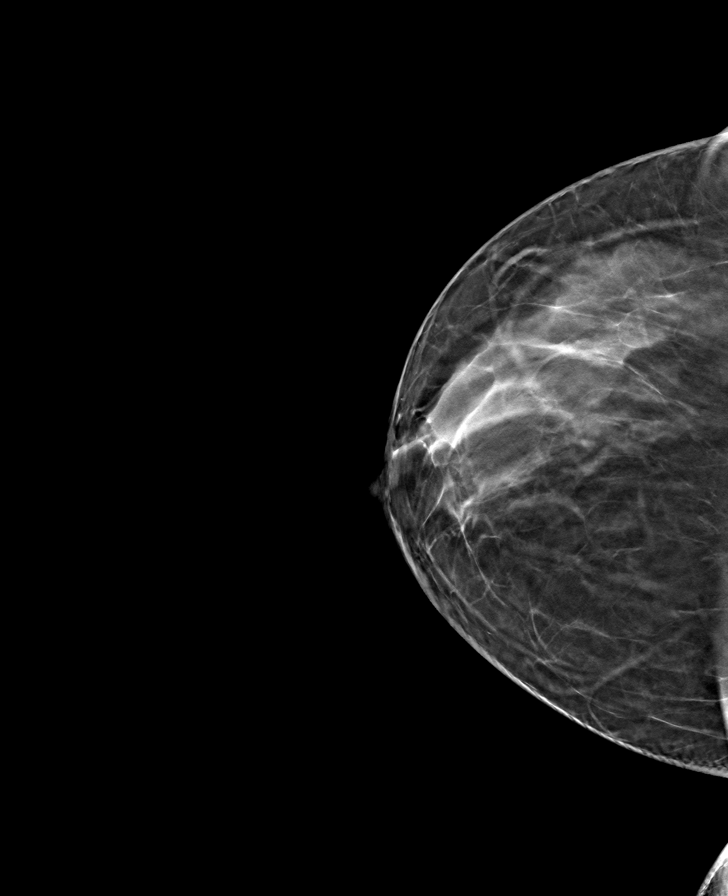

[8 of 24 positions shown; findings below may reference images not displayed]

ACR Breast Density Category c: The breast tissue is heterogeneously
dense, which may obscure small masses.
FINDINGS: There are no findings suspicious for malignancy.
IMPRESSION: No mammographic evidence of malignancy. A result letter of this
screening mammogram will be mailed directly to the patient.

RECOMMENDATION:
Screening mammogram in one year. (Code:Q3-W-BC3)

BI-RADS CATEGORY  1: Negative.
